# Patient Record
Sex: Male | Born: 1965 | Hispanic: Yes | Marital: Married | Smoking: Current every day smoker
Health system: Southern US, Community
[De-identification: ages and names within clinical notes are randomized; demographics above are authoritative.]

## PROBLEM LIST (undated history)

## (undated) DIAGNOSIS — R7303 Prediabetes: Secondary | ICD-10-CM

## (undated) HISTORY — DX: Prediabetes: R73.03

## (undated) HISTORY — PX: OTHER SURGICAL HISTORY: SHX169

## (undated) HISTORY — PX: HERNIA REPAIR: SHX51

---

## 2008-08-18 HISTORY — PX: FINGER AMPUTATION: SHX636

## 2013-02-24 ENCOUNTER — Emergency Department (HOSPITAL_COMMUNITY)
Admission: EM | Admit: 2013-02-24 | Discharge: 2013-02-24 | Disposition: A | Discharge status: Home / Self Care | Payer: Self-pay | Attending: Emergency Medicine | Admitting: Emergency Medicine

## 2013-02-24 ENCOUNTER — Encounter (HOSPITAL_COMMUNITY): Payer: Self-pay | Admitting: Cardiology

## 2013-02-24 ENCOUNTER — Emergency Department (HOSPITAL_COMMUNITY): Payer: Self-pay

## 2013-02-24 DIAGNOSIS — Y9389 Activity, other specified: Secondary | ICD-10-CM | POA: Insufficient documentation

## 2013-02-24 DIAGNOSIS — S3992XA Unspecified injury of lower back, initial encounter: Secondary | ICD-10-CM

## 2013-02-24 DIAGNOSIS — W010XXA Fall on same level from slipping, tripping and stumbling without subsequent striking against object, initial encounter: Secondary | ICD-10-CM | POA: Insufficient documentation

## 2013-02-24 DIAGNOSIS — F17200 Nicotine dependence, unspecified, uncomplicated: Secondary | ICD-10-CM | POA: Insufficient documentation

## 2013-02-24 DIAGNOSIS — Y92009 Unspecified place in unspecified non-institutional (private) residence as the place of occurrence of the external cause: Secondary | ICD-10-CM | POA: Insufficient documentation

## 2013-02-24 DIAGNOSIS — IMO0002 Reserved for concepts with insufficient information to code with codable children: Secondary | ICD-10-CM | POA: Insufficient documentation

## 2013-02-24 MED ORDER — HYDROCODONE-ACETAMINOPHEN 5-325 MG PO TABS
1.0000 | ORAL_TABLET | Freq: Once | ORAL | Status: AC
  Administered 2013-02-24: 1 via ORAL
  Filled 2013-02-24: qty 1

## 2013-02-24 MED ORDER — HYDROCODONE-ACETAMINOPHEN 5-325 MG PO TABS: 2.0000 | ORAL_TABLET | ORAL | Status: DC | PRN

## 2013-02-24 MED ORDER — METHOCARBAMOL 500 MG PO TABS: 500.0000 mg | ORAL_TABLET | Freq: Two times a day (BID) | ORAL | Status: DC

## 2013-02-24 NOTE — ED Notes (Signed)
Pt reports that he was pulling carpet and tripped on a pipe and fell. Now reports lower back pain. Sensation intact. No obvious injury or bruising.

## 2013-02-24 NOTE — ED Provider Notes (Signed)
History    This chart was scribed for Fayrene Helper, non-physician practitioner working with Juliet Rude. Rubin Payor, MD by Leone Payor, ED Scribe. This patient was seen in room TR10C/TR10C and the patient's care was started at 1631.  CSN: [id number] Arrival date & time 02/24/13  1631  None    Chief Complaint  Patient presents with  . Back Pain    The history is provided by the patient. No language interpreter was used.    HPI Comments: Isaac Austin is a 47 y.o. male who presents to the Emergency Department complaining of constant, unchanged lower back pain that started 2 hours ago. Reports he was pulling carpet when he stepped on a PVC pipe and fell. States he hit his back on a hard floor. Pt was able to ambulate after the fall. He denies LOC. He describes the pain as stabbing. Pain is aggravated by movement, flexion and lateral rotation. Walking is tolerable but pain is worse with sitting position. He has taken 400 mg of motrin with moderate relief. Pt denies any radiation. He denies any numbness.    History reviewed. No pertinent past medical history. History reviewed. No pertinent past surgical history. History reviewed. No pertinent family history. History  Substance Use Topics  . Smoking status: Current Every Day Smoker  . Smokeless tobacco: Not on file  . Alcohol Use: Yes    Review of Systems  Musculoskeletal: Positive for back pain. Negative for gait problem.  Neurological: Negative for syncope, weakness and numbness.    Allergies  Review of patient's allergies indicates no known allergies.  Home Medications  No current outpatient prescriptions on file. BP 126/78  Pulse 89  Temp(Src) 98.5 F (36.9 C) (Oral)  Resp 20  SpO2 97% Physical Exam  Nursing note and vitals reviewed. Constitutional: He is oriented to person, place, and time. He appears well-developed and well-nourished.  Appears to be in no acute distress, sitting comfortably  in bed.   HENT:   Head: Normocephalic and atraumatic.  Eyes: Conjunctivae and EOM are normal. Pupils are equal, round, and reactive to light.  Neck: Normal range of motion. Neck supple.  Cardiovascular: Normal rate, regular rhythm and normal heart sounds.   Pulmonary/Chest: Effort normal and breath sounds normal.  Abdominal: Soft. Bowel sounds are normal.  Musculoskeletal: Normal range of motion.  Has para lumbar spine tender to palpation. Increasing pain with back flexion and lateral rotation. No significant midline tenderness, step offs, or crepitus.   Neurological: He is alert and oriented to person, place, and time.  Normal gait. Patellar and DT reflexes 2+. No foot drop.    Skin: Skin is warm and dry.  Psychiatric: He has a normal mood and affect.    ED Course  Procedures (including critical care time)  DIAGNOSTIC STUDIES: Oxygen Saturation is 97% on RA, adequate by my interpretation.    COORDINATION OF CARE: 5:03 PM Discussed treatment plan with pt at bedside and pt agreed to plan.   6:40 PM Xray neg for acute fx or dislocation.  Reassurance given.  Likely lower back contusion and muscle strain.  Stable for discharge.  Able to ambulate.   Labs Reviewed - No data to display Dg Lumbar Spine Complete  02/24/2013   *RADIOLOGY REPORT*  Clinical Data: Fall.  Low back pain.  LUMBAR SPINE - COMPLETE 4+ VIEW  Comparison: None.  Findings: No evidence of acute fracture, spondylolysis, or spondylolisthesis.  Mild degenerative disc disease is seen at L2-3.  Other intervertebral disc  spaces are maintained.  No evidence of facet arthropathy or other significant bone abnormality.  IMPRESSION:  1.  No acute findings. 2.  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   Original Report Authenticated By: Myles Rosenthal, M.D.   1. Lower back injury, initial encounter     MDM  BP 126/78  Pulse 89  Temp(Src) 98.5 F (36.9 C) (Oral)  Resp 20  SpO2 97%  I have reviewed nursing notes and vital signs. I personally reviewed  the imaging tests through PACS system  I reviewed available ER/hospitalization records thought the EMR   I personally performed the services described in this documentation, which was scribed in my presence. The recorded information has been reviewed and is accurate.    Fayrene Helper, PA-C 02/24/13 [phone]

## 2013-02-26 NOTE — ED Provider Notes (Signed)
Medical screening examination/treatment/procedure(s) were performed by non-physician practitioner and as supervising physician I was immediately available for consultation/collaboration.  Alexx Mcburney R. Ramses Klecka, MD 02/26/13 1358 

## 2016-08-18 DIAGNOSIS — R7303 Prediabetes: Secondary | ICD-10-CM | POA: Insufficient documentation

## 2016-08-18 HISTORY — DX: Prediabetes: R73.03

## 2018-03-30 ENCOUNTER — Encounter: Payer: Self-pay | Admitting: Internal Medicine

## 2018-03-30 ENCOUNTER — Ambulatory Visit: Payer: Self-pay | Admitting: Internal Medicine

## 2018-03-30 VITALS — BP 110/82 | HR 70 | Resp 12 | Ht 65.0 in | Wt 181.0 lb

## 2018-03-30 DIAGNOSIS — Z716 Tobacco abuse counseling: Secondary | ICD-10-CM

## 2018-03-30 DIAGNOSIS — E669 Obesity, unspecified: Secondary | ICD-10-CM

## 2018-03-30 DIAGNOSIS — Z6830 Body mass index (BMI) 30.0-30.9, adult: Secondary | ICD-10-CM

## 2018-03-30 DIAGNOSIS — K439 Ventral hernia without obstruction or gangrene: Secondary | ICD-10-CM | POA: Insufficient documentation

## 2018-03-30 DIAGNOSIS — R7303 Prediabetes: Secondary | ICD-10-CM

## 2018-03-30 DIAGNOSIS — Z720 Tobacco use: Secondary | ICD-10-CM | POA: Insufficient documentation

## 2018-03-30 NOTE — Progress Notes (Signed)
Patient ID: Isaac ScheuermannSalvador Cruz, male   DOB: 05-20-1966, 52 y.o.   MRN: [id number]  LCSW completed new patient screening with patient in order to assess for mental health concerns and/or problems with social determinants of health (food, housing, transportation, interpersonal violence). LCSW administered a PHQ-9 in order to assess the level of current depressive symptoms.  Patient reported that he had no issues with any SDOH. He shared that he feels fatigued every day but felt that the cause was his job. He denied any other depressive symptoms. He shared that he was briefly depressed and suicidal about 5 years ago, as a result of getting part of his finger cut off and being unable to work, but that he has not had any suicidal thoughts since then.
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
## 2018-03-30 NOTE — Patient Instructions (Signed)
Tome un vaso de agua antes de cada comida Tome un minimo de 6 a 8 vasos de agua diarios Coma tres veces al dia Coma una proteina y Neomia Dearuna grasa saludable con comida.  (huevos, pescado, pollo, pavo, y limite carnes rojas Coma 5 porciones diarias de legumbres.  Mezcle los colores Coma 2 porciones diarias de frutas con cascara cuando sea comestible Use platos pequeos Suelte su tenedor o cuchara despues de cada mordida hata que se mastique y se trague Come en la mesa con amigos o familiares por lo menos una vez al dia Apague la televisin y aparatos electrnicos durante la comida  Su objetivo debe ser perder una libra por semana  Estudios recientes indican que las personas quienes consumen todos de sus calorias durante 12 horas se bajan de pesocon Mas eficiencia.  Por ejemplo, si Usted come su primera comida a las 7:00 a.m., su comida final del dia se debe completar antes de las 7:00 p.m.  Tobacco Cessation:   1800QUITNOW or [phone], the former for support and possibly free nicotine patches/gum and support; the latter for [facility] Smoking cessation class. Get rid of all smoking supplies:  Cigarettes, lighters, ashtrays--no stashes just in case at home if you are serious.  For Nicotine gum:  When you feel need for smoking:  Place nicotine gum in mouth and chew until juicy, then stick between gum and cheek.  You will absorb the nicotine from your mouth.   Do not aggressively chew gum. Spit gum out in garbage once you feel you are done with it.

## 2018-04-01 ENCOUNTER — Other Ambulatory Visit: Payer: Self-pay

## 2018-04-01 DIAGNOSIS — Z79899 Other long term (current) drug therapy: Secondary | ICD-10-CM

## 2018-04-01 DIAGNOSIS — Z13220 Encounter for screening for lipoid disorders: Secondary | ICD-10-CM

## 2018-04-01 DIAGNOSIS — R7303 Prediabetes: Secondary | ICD-10-CM

## 2018-04-02 LAB — COMPREHENSIVE METABOLIC PANEL
A/G RATIO: 1.3 (ref 1.2–2.2)
ALT: 30 IU/L (ref 0–44)
AST: 22 IU/L (ref 0–40)
Albumin: 4.3 g/dL (ref 3.5–5.5)
Alkaline Phosphatase: 62 IU/L (ref 39–117)
BILIRUBIN TOTAL: 0.5 mg/dL (ref 0.0–1.2)
BUN/Creatinine Ratio: 17 (ref 9–20)
BUN: 14 mg/dL (ref 6–24)
CHLORIDE: 104 mmol/L (ref 96–106)
CO2: 24 mmol/L (ref 20–29)
Calcium: 9.1 mg/dL (ref 8.7–10.2)
Creatinine, Ser: 0.83 mg/dL (ref 0.76–1.27)
GFR calc Af Amer: 118 mL/min/{1.73_m2} (ref >59)
GFR calc non Af Amer: 102 mL/min/{1.73_m2} (ref >59)
Globulin, Total: 3.3 g/dL (ref 1.5–4.5)
Glucose: 108 mg/dL — ABNORMAL HIGH (ref 65–99)
POTASSIUM: 4.4 mmol/L (ref 3.5–5.2)
Sodium: 140 mmol/L (ref 134–144)
Total Protein: 7.6 g/dL (ref 6.0–8.5)

## 2018-04-02 LAB — CBC WITH DIFFERENTIAL/PLATELET
BASOS: 0 %
Basophils Absolute: 0 10*3/uL (ref 0.0–0.2)
EOS (ABSOLUTE): 0 10*3/uL (ref 0.0–0.4)
Eos: 1 %
Hematocrit: 45.5 % (ref 37.5–51.0)
Hemoglobin: 15.1 g/dL (ref 13.0–17.7)
IMMATURE GRANULOCYTES: 0 %
Immature Grans (Abs): 0 10*3/uL (ref 0.0–0.1)
Lymphocytes Absolute: 1.9 10*3/uL (ref 0.7–3.1)
Lymphs: 38 %
MCH: 29.3 pg (ref 26.6–33.0)
MCHC: 33.2 g/dL (ref 31.5–35.7)
MCV: 88 fL (ref 79–97)
MONOS ABS: 0.4 10*3/uL (ref 0.1–0.9)
Monocytes: 9 %
NEUTROS PCT: 52 %
Neutrophils Absolute: 2.6 10*3/uL (ref 1.4–7.0)
Platelets: 190 10*3/uL (ref 150–450)
RBC: 5.15 x10E6/uL (ref 4.14–5.80)
RDW: 14.6 % (ref 12.3–15.4)
WBC: 4.9 10*3/uL (ref 3.4–10.8)

## 2018-04-02 LAB — LIPID PANEL W/O CHOL/HDL RATIO
Cholesterol, Total: 202 mg/dL — ABNORMAL HIGH (ref 100–199)
HDL: 52 mg/dL (ref >39)
LDL CALC: 132 mg/dL — AB (ref 0–99)
Triglycerides: 91 mg/dL (ref 0–149)
VLDL CHOLESTEROL CAL: 18 mg/dL (ref 5–40)

## 2018-04-02 LAB — HGB A1C W/O EAG: HEMOGLOBIN A1C: 6.1 % — AB (ref 4.8–5.6)

## 2018-04-22 ENCOUNTER — Encounter (HOSPITAL_COMMUNITY): Payer: Self-pay | Admitting: Cardiology

## 2018-11-19 ENCOUNTER — Ambulatory Visit: Payer: Self-pay | Admitting: General Surgery

## 2018-12-30 NOTE — Pre-Procedure Instructions (Signed)
Isaac Austin  12/30/2018      [facility] - [place], [place] - [address] [place] [address] Phone: [phone] Fax: [phone]    Your procedure is scheduled on Fri., Jan 07, 2019 from 9:30AM-11:30AM  Report to [facility] Entr[facility] "A" at 7:30AM  Call this number if you have problems the morning of surgery:  [phone]   Remember:  Do not eat after midnight on May 21st  You may drink clear liquids until 3 hours (6:30AM) prior to surgery time .  Clear liquids allowed are: Water, Juice (non-citric and without pulp), Carbonated beverages, Clear Tea, Black Coffee only, Plain Jell-O only, Gatorade and Plain Popsicles only    Take these medicines the morning of surgery with A SIP OF WATER: If needed: Cetirizine (ZYRTEC)   As of today, stop taking all Other Aspirin Products, Vitamins, Fish oils, and Herbal medications. Also stop all NSAIDS i.e. Advil, Ibuprofen, Motrin, Aleve, Anaprox, Naproxen, BC, Goody Powders, and all Supplements.    Do not wear jewelry.  Do not wear lotions, powders, colognes, or deodorant.  Do not shave 48 hours prior to surgery.  Men may shave face.  Do not bring valuables to the hospital.  [facility] is not responsible for any belongings or valuables.  Contacts, dentures or bridgework may not be worn into surgery.   For patients admitted to the hospital, discharge time will be determined by your treatment team.  Patients discharged the day of surgery will not be allowed to drive home.   Special instructions:   [facility]- Preparing For Surgery  Before surgery, you can play an important role. Because skin is not sterile, your skin needs to be as free of germs as possible. You can reduce the number of germs on your skin by washing with CHG (chlorahexidine gluconate) Soap before surgery.  CHG is an antiseptic cleaner which kills germs and bonds with the skin to continue killing germs even  after washing.    Oral Hygiene is also important to reduce your risk of infection.  Remember - BRUSH YOUR TEETH THE MORNING OF SURGERY WITH YOUR REGULAR TOOTHPASTE  Please do not use if you have an allergy to CHG or antibacterial soaps. If your skin becomes reddened/irritated stop using the CHG.  Do not shave (including legs and underarms) for at least 48 hours prior to first CHG shower. It is OK to shave your face.  Please follow these instructions carefully.   1. Shower the NIGHT BEFORE SURGERY and the MORNING OF SURGERY with CHG.   2. If you chose to wash your hair, wash your hair first as usual with your normal shampoo.  3. After you shampoo, rinse your hair and body thoroughly to remove the shampoo.  4. Use CHG as you would any other liquid soap. You can apply CHG directly to the skin and wash gently with a scrungie or a clean washcloth.   5. Apply the CHG Soap to your body ONLY FROM THE NECK DOWN.  Do not use on open wounds or open sores. Avoid contact with your eyes, ears, mouth and genitals (private parts). Wash Face and genitals (private parts)  with your normal soap.  6. Wash thoroughly, paying special attention to the area where your surgery will be performed.  7. Thoroughly rinse your body with warm water from the neck down.  8. DO NOT shower/wash with your normal soap after using and rinsing off the  CHG Soap.  9. Pat yourself dry with a CLEAN TOWEL.  10. Wear CLEAN PAJAMAS to bed the night before surgery, wear comfortable clothes the morning of surgery  11. Place CLEAN SHEETS on your bed the night of your first shower and DO NOT SLEEP WITH PETS.  Day of Surgery:  Do not apply any deodorants/lotions.  Please wear clean clothes to the hospital/surgery center.   Remember to brush your teeth WITH YOUR REGULAR TOOTHPASTE.  Please read over the following fact sheets that you were given. Pain Booklet, Coughing and Deep Breathing and Surgical Site Infection  Prevention

## 2018-12-31 ENCOUNTER — Encounter (HOSPITAL_COMMUNITY)
Admission: RE | Admit: 2018-12-31 | Discharge: 2018-12-31 | Disposition: A | Discharge status: Home / Self Care | Payer: Self-pay | Source: Ambulatory Visit | Attending: General Surgery | Admitting: General Surgery

## 2018-12-31 ENCOUNTER — Encounter (HOSPITAL_COMMUNITY): Payer: Self-pay

## 2018-12-31 ENCOUNTER — Other Ambulatory Visit: Payer: Self-pay

## 2018-12-31 DIAGNOSIS — Z01812 Encounter for preprocedural laboratory examination: Secondary | ICD-10-CM | POA: Insufficient documentation

## 2018-12-31 LAB — BASIC METABOLIC PANEL
Anion gap: 9 (ref 5–15)
BUN: 12 mg/dL (ref 6–20)
CO2: 24 mmol/L (ref 22–32)
Calcium: 9.2 mg/dL (ref 8.9–10.3)
Chloride: 104 mmol/L (ref 98–111)
Creatinine, Ser: 0.95 mg/dL (ref 0.61–1.24)
GFR calc Af Amer: >60 mL/min (ref >60)
GFR calc non Af Amer: >60 mL/min (ref >60)
Glucose, Bld: 129 mg/dL — ABNORMAL HIGH (ref 70–99)
Potassium: 4 mmol/L (ref 3.5–5.1)
Sodium: 137 mmol/L (ref 135–145)

## 2018-12-31 LAB — CBC
HCT: 45.3 % (ref 39.0–52.0)
Hemoglobin: 15.2 g/dL (ref 13.0–17.0)
MCH: 28.8 pg (ref 26.0–34.0)
MCHC: 33.6 g/dL (ref 30.0–36.0)
MCV: 86 fL (ref 80.0–100.0)
Platelets: 185 10*3/uL (ref 150–400)
RBC: 5.27 MIL/uL (ref 4.22–5.81)
RDW: 13.3 % (ref 11.5–15.5)
WBC: 5.8 10*3/uL (ref 4.0–10.5)
nRBC: 0 % (ref 0.0–0.2)

## 2018-12-31 LAB — GLUCOSE, CAPILLARY: Glucose-Capillary: 111 mg/dL — ABNORMAL HIGH (ref 70–99)

## 2018-12-31 NOTE — Progress Notes (Signed)
PCP -Dr. Julieanne Manson  Cardiologist - denies  Chest x-ray - N/A EKG - N/A Stress Test -denies  ECHO - denies Cardiac Cath - denies  Sleep Study - denies CPAP - denies  Blood Thinner Instructions:N/A Aspirin Instructions:N/A  Anesthesia review: No  This RN made an appointment for patient's CoVID-19 testing for Tuesday, May 19th at 1100 at the drive thru testing site. Interpreter was here to help verbalize instructions. Pt also educated on quarantine after testing which includes not going to work. Pt verbally agrees.   Patient denies shortness of breath, fever, cough and chest pain at PAT appointment   Patient verbalized understanding of instructions that were given to them at the PAT appointment. Patient was also instructed that they will need to review over the PAT instructions again at home before surgery.    Coronavirus Screening  Have you experienced the following symptoms:  Cough yes/no: No Fever (>100.52F)  yes/no: No Runny nose yes/no: No Sore throat yes/no: No Difficulty breathing/shortness of breath  yes/no: No  Have you or a family member traveled in the last 14 days and where? yes/no: No   If the patient indicates "YES" to the above questions, their PAT will be rescheduled to limit the exposure to others and, the surgeon will be notified. THE PATIENT WILL NEED TO BE ASYMPTOMATIC FOR 14 DAYS.   If the patient is not experiencing any of these symptoms, the PAT nurse will instruct them to NOT bring anyone with them to their appointment since they may have these symptoms or traveled as well.   Please remind your patients and families that hospital visitation restrictions are in effect and the importance of the restrictions.

## 2019-01-04 ENCOUNTER — Other Ambulatory Visit (HOSPITAL_COMMUNITY)
Admission: RE | Admit: 2019-01-04 | Discharge: 2019-01-04 | Disposition: A | Discharge status: Home / Self Care | Payer: HRSA Program | Source: Ambulatory Visit | Attending: General Surgery | Admitting: General Surgery

## 2019-01-04 DIAGNOSIS — Z1159 Encounter for screening for other viral diseases: Secondary | ICD-10-CM | POA: Diagnosis present

## 2019-01-05 LAB — NOVEL CORONAVIRUS, NAA (HOSP ORDER, SEND-OUT TO REF LAB; TAT 18-24 HRS): SARS-CoV-2, NAA: NOT DETECTED

## 2019-01-07 ENCOUNTER — Ambulatory Visit (HOSPITAL_COMMUNITY)
Admission: RE | Admit: 2019-01-07 | Discharge: 2019-01-07 | Disposition: A | Discharge status: Home / Self Care | Payer: Self-pay | Attending: General Surgery | Admitting: General Surgery

## 2019-01-07 ENCOUNTER — Encounter (HOSPITAL_COMMUNITY)
Admission: RE | Disposition: A | Discharge status: Home / Self Care | Payer: Self-pay | Source: Home / Self Care | Attending: General Surgery

## 2019-01-07 ENCOUNTER — Ambulatory Visit (HOSPITAL_COMMUNITY): Payer: Self-pay | Admitting: Vascular Surgery

## 2019-01-07 ENCOUNTER — Other Ambulatory Visit: Payer: Self-pay

## 2019-01-07 ENCOUNTER — Ambulatory Visit (HOSPITAL_COMMUNITY): Payer: Self-pay | Admitting: Certified Registered Nurse Anesthetist

## 2019-01-07 ENCOUNTER — Encounter (HOSPITAL_COMMUNITY): Payer: Self-pay

## 2019-01-07 DIAGNOSIS — R7303 Prediabetes: Secondary | ICD-10-CM | POA: Insufficient documentation

## 2019-01-07 DIAGNOSIS — K439 Ventral hernia without obstruction or gangrene: Secondary | ICD-10-CM | POA: Insufficient documentation

## 2019-01-07 DIAGNOSIS — F17200 Nicotine dependence, unspecified, uncomplicated: Secondary | ICD-10-CM | POA: Insufficient documentation

## 2019-01-07 HISTORY — PX: VENTRAL HERNIA REPAIR: SHX424

## 2019-01-07 LAB — GLUCOSE, CAPILLARY: Glucose-Capillary: 104 mg/dL — ABNORMAL HIGH (ref 70–99)

## 2019-01-07 SURGERY — REPAIR, HERNIA, VENTRAL, LAPAROSCOPIC
Anesthesia: General | Site: Abdomen

## 2019-01-07 MED ORDER — HYDROCODONE-ACETAMINOPHEN 5-325 MG PO TABS
ORAL_TABLET | ORAL | Status: AC
  Filled 2019-01-07: qty 2

## 2019-01-07 MED ORDER — FENTANYL CITRATE (PF) 250 MCG/5ML IJ SOLN
INTRAMUSCULAR | Status: AC
  Filled 2019-01-07: qty 5

## 2019-01-07 MED ORDER — ACETAMINOPHEN 500 MG PO TABS
ORAL_TABLET | ORAL | Status: AC
  Administered 2019-01-07: 08:00:00 1000 mg via ORAL
  Filled 2019-01-07: qty 2

## 2019-01-07 MED ORDER — CEFAZOLIN SODIUM-DEXTROSE 2-4 GM/100ML-% IV SOLN
2.0000 g | INTRAVENOUS | Status: AC
  Administered 2019-01-07: 2 g via INTRAVENOUS

## 2019-01-07 MED ORDER — HYDROMORPHONE HCL 1 MG/ML IJ SOLN
INTRAMUSCULAR | Status: AC
  Filled 2019-01-07: qty 1

## 2019-01-07 MED ORDER — HYDROCODONE-ACETAMINOPHEN 5-325 MG PO TABS: 1.0000 | ORAL_TABLET | Freq: Four times a day (QID) | ORAL | 0 refills | Status: AC | PRN

## 2019-01-07 MED ORDER — DEXAMETHASONE SODIUM PHOSPHATE 10 MG/ML IJ SOLN
INTRAMUSCULAR | Status: DC | PRN
  Administered 2019-01-07: 5 mg via INTRAVENOUS

## 2019-01-07 MED ORDER — CHLORHEXIDINE GLUCONATE CLOTH 2 % EX PADS: 6.0000 | MEDICATED_PAD | Freq: Once | CUTANEOUS | Status: DC

## 2019-01-07 MED ORDER — BUPIVACAINE-EPINEPHRINE 0.25% -1:200000 IJ SOLN
INTRAMUSCULAR | Status: DC | PRN
  Administered 2019-01-07: 24 mL

## 2019-01-07 MED ORDER — BUPIVACAINE-EPINEPHRINE (PF) 0.25% -1:200000 IJ SOLN
INTRAMUSCULAR | Status: AC
  Filled 2019-01-07: qty 30

## 2019-01-07 MED ORDER — ROCURONIUM BROMIDE 50 MG/5ML IV SOSY
PREFILLED_SYRINGE | INTRAVENOUS | Status: DC | PRN
  Administered 2019-01-07: 10 mg via INTRAVENOUS
  Administered 2019-01-07: 50 mg via INTRAVENOUS

## 2019-01-07 MED ORDER — FENTANYL CITRATE (PF) 250 MCG/5ML IJ SOLN
INTRAMUSCULAR | Status: DC | PRN
  Administered 2019-01-07: 100 ug via INTRAVENOUS
  Administered 2019-01-07: 25 ug via INTRAVENOUS
  Administered 2019-01-07: 50 ug via INTRAVENOUS

## 2019-01-07 MED ORDER — CELECOXIB 200 MG PO CAPS
ORAL_CAPSULE | ORAL | Status: AC
  Administered 2019-01-07: 08:00:00 200 mg via ORAL
  Filled 2019-01-07: qty 1

## 2019-01-07 MED ORDER — PHENYLEPHRINE 40 MCG/ML (10ML) SYRINGE FOR IV PUSH (FOR BLOOD PRESSURE SUPPORT)
PREFILLED_SYRINGE | INTRAVENOUS | Status: AC
  Filled 2019-01-07: qty 10

## 2019-01-07 MED ORDER — CELECOXIB 200 MG PO CAPS
200.0000 mg | ORAL_CAPSULE | ORAL | Status: AC
  Administered 2019-01-07: 08:00:00 200 mg via ORAL

## 2019-01-07 MED ORDER — SUCCINYLCHOLINE CHLORIDE 200 MG/10ML IV SOSY
PREFILLED_SYRINGE | INTRAVENOUS | Status: DC | PRN
  Administered 2019-01-07: 100 mg via INTRAVENOUS

## 2019-01-07 MED ORDER — LIDOCAINE 2% (20 MG/ML) 5 ML SYRINGE
INTRAMUSCULAR | Status: DC | PRN
  Administered 2019-01-07: 60 mg via INTRAVENOUS

## 2019-01-07 MED ORDER — 0.9 % SODIUM CHLORIDE (POUR BTL) OPTIME
TOPICAL | Status: DC | PRN
  Administered 2019-01-07: 1000 mL

## 2019-01-07 MED ORDER — MEPERIDINE HCL 25 MG/ML IJ SOLN: 6.2500 mg | INTRAMUSCULAR | Status: DC | PRN

## 2019-01-07 MED ORDER — MIDAZOLAM HCL 2 MG/2ML IJ SOLN
INTRAMUSCULAR | Status: AC
  Filled 2019-01-07: qty 2

## 2019-01-07 MED ORDER — SUGAMMADEX SODIUM 200 MG/2ML IV SOLN
INTRAVENOUS | Status: DC | PRN
  Administered 2019-01-07: 200 mg via INTRAVENOUS

## 2019-01-07 MED ORDER — CEFAZOLIN SODIUM-DEXTROSE 2-4 GM/100ML-% IV SOLN
INTRAVENOUS | Status: AC
  Filled 2019-01-07: qty 100

## 2019-01-07 MED ORDER — HYDROMORPHONE HCL 1 MG/ML IJ SOLN
0.2500 mg | INTRAMUSCULAR | Status: DC | PRN
  Administered 2019-01-07 (×4): 0.5 mg via INTRAVENOUS

## 2019-01-07 MED ORDER — GABAPENTIN 300 MG PO CAPS
ORAL_CAPSULE | ORAL | Status: AC
  Administered 2019-01-07: 08:00:00 300 mg via ORAL
  Filled 2019-01-07: qty 1

## 2019-01-07 MED ORDER — ONDANSETRON HCL 4 MG/2ML IJ SOLN
INTRAMUSCULAR | Status: DC | PRN
  Administered 2019-01-07: 4 mg via INTRAVENOUS

## 2019-01-07 MED ORDER — PROPOFOL 10 MG/ML IV BOLUS
INTRAVENOUS | Status: DC | PRN
  Administered 2019-01-07: 200 mg via INTRAVENOUS

## 2019-01-07 MED ORDER — MIDAZOLAM HCL 2 MG/2ML IJ SOLN
INTRAMUSCULAR | Status: DC | PRN
  Administered 2019-01-07: 2 mg via INTRAVENOUS

## 2019-01-07 MED ORDER — LACTATED RINGERS IV SOLN
INTRAVENOUS | Status: DC
  Administered 2019-01-07 (×2): via INTRAVENOUS

## 2019-01-07 MED ORDER — MIDAZOLAM HCL 2 MG/2ML IJ SOLN: 0.5000 mg | Freq: Once | INTRAMUSCULAR | Status: DC | PRN

## 2019-01-07 MED ORDER — GABAPENTIN 300 MG PO CAPS
300.0000 mg | ORAL_CAPSULE | ORAL | Status: AC
  Administered 2019-01-07: 08:00:00 300 mg via ORAL

## 2019-01-07 MED ORDER — PROMETHAZINE HCL 25 MG/ML IJ SOLN: 6.2500 mg | INTRAMUSCULAR | Status: DC | PRN

## 2019-01-07 MED ORDER — PROPOFOL 10 MG/ML IV BOLUS
INTRAVENOUS | Status: AC
  Filled 2019-01-07: qty 20

## 2019-01-07 MED ORDER — FENTANYL CITRATE (PF) 100 MCG/2ML IJ SOLN
INTRAMUSCULAR | Status: AC
  Filled 2019-01-07: qty 2

## 2019-01-07 MED ORDER — ACETAMINOPHEN 500 MG PO TABS
1000.0000 mg | ORAL_TABLET | ORAL | Status: AC
  Administered 2019-01-07: 08:00:00 1000 mg via ORAL

## 2019-01-07 MED ORDER — HYDROCODONE-ACETAMINOPHEN 5-325 MG PO TABS
1.0000 | ORAL_TABLET | Freq: Four times a day (QID) | ORAL | Status: DC | PRN
  Administered 2019-01-07: 12:00:00 2 via ORAL

## 2019-01-07 SURGICAL SUPPLY — 43 items
BINDER ABDOMINAL 12 ML 46-62 (SOFTGOODS) IMPLANT
BNDG GAUZE ELAST 4 BULKY (GAUZE/BANDAGES/DRESSINGS) IMPLANT
CANISTER SUCT 3000ML PPV (MISCELLANEOUS) IMPLANT
CHLORAPREP W/TINT 26ML (MISCELLANEOUS) ×3 IMPLANT
COVER SURGICAL LIGHT HANDLE (MISCELLANEOUS) ×3 IMPLANT
COVER WAND RF STERILE (DRAPES) ×3 IMPLANT
DERMABOND ADVANCED (GAUZE/BANDAGES/DRESSINGS) ×2
DERMABOND ADVANCED .7 DNX12 (GAUZE/BANDAGES/DRESSINGS) ×1 IMPLANT
DEVICE SECURE STRAP 25 ABSORB (INSTRUMENTS) ×6 IMPLANT
DEVICE TROCAR PUNCTURE CLOSURE (ENDOMECHANICALS) ×3 IMPLANT
DRAPE INCISE IOBAN 66X45 STRL (DRAPES) ×3 IMPLANT
DRAPE LAPAROSCOPIC ABDOMINAL (DRAPES) ×3 IMPLANT
ELECT CAUTERY BLADE 6.4 (BLADE) ×3 IMPLANT
ELECT REM PT RETURN 9FT ADLT (ELECTROSURGICAL) ×3
ELECTRODE REM PT RTRN 9FT ADLT (ELECTROSURGICAL) ×1 IMPLANT
GLOVE BIO SURGEON STRL SZ7.5 (GLOVE) ×3 IMPLANT
GOWN STRL REUS W/ TWL LRG LVL3 (GOWN DISPOSABLE) ×3 IMPLANT
GOWN STRL REUS W/TWL LRG LVL3 (GOWN DISPOSABLE) ×6
KIT BASIN OR (CUSTOM PROCEDURE TRAY) ×3 IMPLANT
KIT TURNOVER KIT B (KITS) ×3 IMPLANT
MARKER SKIN DUAL TIP RULER LAB (MISCELLANEOUS) ×3 IMPLANT
MESH VENTRALIGHT ST 4X6IN (Mesh General) ×3 IMPLANT
NEEDLE SPNL 22GX3.5 QUINCKE BK (NEEDLE) ×3 IMPLANT
NS IRRIG 1000ML POUR BTL (IV SOLUTION) ×3 IMPLANT
PAD ARMBOARD 7.5X6 YLW CONV (MISCELLANEOUS) ×6 IMPLANT
PENCIL BUTTON HOLSTER BLD 10FT (ELECTRODE) ×3 IMPLANT
SCISSORS LAP 5X35 DISP (ENDOMECHANICALS) IMPLANT
SET IRRIG TUBING LAPAROSCOPIC (IRRIGATION / IRRIGATOR) IMPLANT
SET TUBE SMOKE EVAC HIGH FLOW (TUBING) ×3 IMPLANT
SHEARS HARMONIC ACE PLUS 36CM (ENDOMECHANICALS) IMPLANT
SLEEVE ENDOPATH XCEL 5M (ENDOMECHANICALS) ×3 IMPLANT
SUT MNCRL AB 4-0 PS2 18 (SUTURE) ×3 IMPLANT
SUT NOVA NAB DX-16 0-1 5-0 T12 (SUTURE) ×3 IMPLANT
SUT VIC AB 3-0 SH 27 (SUTURE) ×2
SUT VIC AB 3-0 SH 27XBRD (SUTURE) ×1 IMPLANT
TOWEL OR 17X24 6PK STRL BLUE (TOWEL DISPOSABLE) ×3 IMPLANT
TOWEL OR 17X26 10 PK STRL BLUE (TOWEL DISPOSABLE) ×3 IMPLANT
TRAY FOLEY CATH SILVER 16FR (SET/KITS/TRAYS/PACK) ×3 IMPLANT
TRAY LAPAROSCOPIC MC (CUSTOM PROCEDURE TRAY) ×3 IMPLANT
TROCAR XCEL BLUNT TIP 100MML (ENDOMECHANICALS) IMPLANT
TROCAR XCEL NON-BLD 11X100MML (ENDOMECHANICALS) IMPLANT
TROCAR XCEL NON-BLD 5MMX100MML (ENDOMECHANICALS) ×6 IMPLANT
WATER STERILE IRR 1000ML POUR (IV SOLUTION) ×3 IMPLANT

## 2019-01-07 NOTE — Op Note (Signed)
01/07/2019  11:22 AM  PATIENT:  Isaac Austin  53 y.o. male  PRE-OPERATIVE DIAGNOSIS:  VENTRAL HERNIA  POST-OPERATIVE DIAGNOSIS:  VENTRAL HERNIA  PROCEDURE:  Procedure(s): LAPAROSCOPIC ASSISTED VENTRAL HERNIA REPAIR WITH MESH (N/A)  SURGEON:  Surgeon(s) and Role:    * Griselda Miner, MD - Primary    * Claud Kelp, MD - Assisting  PHYSICIAN ASSISTANT:   ASSISTANTS: Dr. Derrell Lolling   ANESTHESIA:   local and general  EBL:  minimal   BLOOD ADMINISTERED:none  DRAINS: none   LOCAL MEDICATIONS USED:  MARCAINE     SPECIMEN:  No Specimen  DISPOSITION OF SPECIMEN:  N/A  COUNTS:  YES  TOURNIQUET:  * No tourniquets in log *  DICTATION: .Dragon Dictation   After informed consent was obtained the patient was brought to the operating room and placed in the supine position on the operating table.  After adequate induction of general anesthesia the patient's abdomen was prepped with ChloraPrep, allowed to dry, and draped in usual sterile manner including the use of an Ioban drape.  An appropriate timeout was performed.  A site was chosen in the right upper quadrant for access in the abdominal cavity.  This area was infiltrated with quarter percent Marcaine.  A small stab incision was made with a 15 blade knife.  A 5 mm Optiview port and camera were used to bluntly dissected the layers of the abdominal wall under direct vision until access was gained to the abdominal cavity.  The abdomen was then insufflated with carbon dioxide without difficulty.  Another 5 mm port was placed in the right lower quadrant under direct vision.  Third 5 mm port was placed on the left midabdomen also under direct vision.  The abdomen was inspected and there were some omental adhesion to the anterior abdominal wall just above the umbilicus.  There was no obvious hernia at the umbilicus.  A harmonic scalpel was used to take down the omental adhesions to the abdominal wall.  I was able to identify a small 2 cm  fascial defect at this site.  I then chose a 10 x 15 cm piece of ventral light mesh.  I placed four #1 Novafil stitches at equidistant points around the edge of the mesh.  The mesh was oriented with the coated side down towards the bowel.  Next a small incision was made overlying the fascial defect with 15 blade knife.  The incision was carried through the skin and subcutaneous tissue sharply with electrocautery until the hernia defect was opened.  The mesh was placed into the abdominal cavity through this opening in the appropriate orientation.  The fascial defect was then closed with a figure-of-eight #1 Novafil stitch.  The abdomen was then insufflated again.  4 small stab incisions were made at points corresponding to the placement of the anchor stitches.  A suture passer was then used to pass through the abdominal wall and bring each tag of Novafil through the abdominal wall at the appropriate position.  Once this was accomplished all 4 Novafil stitches were pulled up tight against the abdominal wall and the mesh was seen to nicely approximate the abdominal wall without any gaps or redundancy.  Each of the stitches was cinched down and tied.  Next a secure strap tacker was used to place tacks between the stitches so that there were no gaps and the mesh was in good apposition to the abdominal wall.  Once this was accomplished the mesh appeared to be in  good position and the hernia seem well repaired and covered.  The area was examined and found to be hemostatic.  The rest of the abdominal cavity was inspected and no other abnormalities were noted.  The gas was then allowed to escape and the ports were removed.  The incisions were then closed with interrupted 4-0 Monocryl subcuticular stitches.  Dermabond dressings were applied.  The patient tolerated the procedure well.  At the end of the case all needle sponge and instrument counts were correct.  The patient was then awakened and taken to recovery in stable  condition.  PLAN OF CARE: Discharge to home after PACU  PATIENT DISPOSITION:  PACU - hemodynamically stable.   Delay start of Pharmacological VTE agent (>24hrs) due to surgical blood loss or risk of bleeding: not applicable

## 2019-01-07 NOTE — Interval H&P Note (Signed)
History and Physical Interval Note:  01/07/2019 9:52 AM  Isaac Austin  has presented today for surgery, with the diagnosis of VENTRAL HERNIA.  The various methods of treatment have been discussed with the patient and family. After consideration of risks, benefits and other options for treatment, the patient has consented to  Procedure(s): LAPAROSCOPIC ASSISTED VENTRAL HERNIA REPAIR WITH MESH (N/A) as a surgical intervention.  The patient's history has been reviewed, patient examined, no change in status, stable for surgery.  I have reviewed the patient's chart and labs.  Questions were answered to the patient's satisfaction.     Chevis Pretty III

## 2019-01-07 NOTE — Anesthesia Postprocedure Evaluation (Signed)
Anesthesia Post Note  Patient: Isaac Austin  Procedure(s) Performed: LAPAROSCOPIC ASSISTED VENTRAL HERNIA REPAIR WITH MESH (N/A Abdomen)     Patient location during evaluation: PACU Anesthesia Type: General Level of consciousness: awake and alert Pain management: pain level controlled Vital Signs Assessment: post-procedure vital signs reviewed and stable Respiratory status: spontaneous breathing, nonlabored ventilation and respiratory function stable Cardiovascular status: blood pressure returned to baseline and stable Postop Assessment: no apparent nausea or vomiting Anesthetic complications: no    Last Vitals:  Vitals:   01/07/19 1215 01/07/19 1219  BP:  135/85  Pulse: 84 92  Resp: 17 18  Temp:    SpO2: 96% 93%                  Beryle Lathe

## 2019-01-07 NOTE — Anesthesia Preprocedure Evaluation (Addendum)
Anesthesia Evaluation  Patient identified by MRN, date of birth, ID band Patient awake    Reviewed: Allergy & Precautions, NPO status , Patient's Chart, lab work & pertinent test results  History of Anesthesia Complications Negative for: history of anesthetic complications  Airway Mallampati: II  TM Distance: >3 FB Neck ROM: Full    Dental  (+) Dental Advisory Given, Partial Upper   Pulmonary Current Smoker,  SARS coronavirus NEG   breath sounds clear to auscultation       Cardiovascular negative cardio ROS   Rhythm:Regular Rate:Normal     Neuro/Psych negative neurological ROS  negative psych ROS   GI/Hepatic negative GI ROS, Neg liver ROS,   Endo/Other   Pre-DM   Renal/GU negative Renal ROS     Musculoskeletal negative musculoskeletal ROS (+)   Abdominal   Peds  Hematology negative hematology ROS (+)   Anesthesia Other Findings   Reproductive/Obstetrics                            Anesthesia Physical Anesthesia Plan  ASA: II  Anesthesia Plan: General   Post-op Pain Management:    Induction: Intravenous  PONV Risk Score and Plan: 3 and Treatment may vary due to age or medical condition, Ondansetron, Midazolam and Dexamethasone  Airway Management Planned: Oral ETT  Additional Equipment: None  Intra-op Plan:   Post-operative Plan: Extubation in OR  Informed Consent: I have reviewed the patients History and Physical, chart, labs and discussed the procedure including the risks, benefits and alternatives for the proposed anesthesia with the patient or authorized representative who has indicated his/her understanding and acceptance.     Dental advisory given  Plan Discussed with: CRNA and Anesthesiologist  Anesthesia Plan Comments:         Anesthesia Quick Evaluation

## 2019-01-07 NOTE — Anesthesia Procedure Notes (Signed)
Procedure Name: Intubation Date/Time: 01/07/2019 9:59 AM Performed by: Modena Morrow, CRNA Pre-anesthesia Checklist: Patient identified, Emergency Drugs available, Suction available, Patient being monitored and Timeout performed Patient Re-evaluated:Patient Re-evaluated prior to induction Oxygen Delivery Method: Circle system utilized Preoxygenation: Pre-oxygenation with 100% oxygen Induction Type: IV induction, Rapid sequence and Cricoid Pressure applied Laryngoscope Size: Miller and 2 Grade View: Grade I Tube type: Oral Number of attempts: 1 Airway Equipment and Method: Stylet and Oral airway Placement Confirmation: ETT inserted through vocal cords under direct vision,  positive ETCO2 and breath sounds checked- equal and bilateral Secured at: 23 cm Tube secured with: Tape Dental Injury: Teeth and Oropharynx as per pre-operative assessment
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
## 2019-01-07 NOTE — Transfer of Care (Signed)
Immediate Anesthesia Transfer of Care Note  Patient: Isaac Austin  Procedure(s) Performed: LAPAROSCOPIC ASSISTED VENTRAL HERNIA REPAIR WITH MESH (N/A Abdomen)  Patient Location: PACU  Anesthesia Type:General  Level of Consciousness: patient cooperative and responds to stimulation  Airway & Oxygen Therapy: Patient Spontanous Breathing  Post-op Assessment: Report given to RN, Post -op Vital signs reviewed and stable and Patient moving all extremities X 4  Post vital signs: Reviewed and stable  Last Vitals:  Vitals Value Taken Time  BP 137/91 01/07/2019 11:34 AM  Temp 36.7 C 01/07/2019 11:33 AM  Pulse 89 01/07/2019 11:35 AM  Resp 20 01/07/2019 11:35 AM  SpO2 96 % 01/07/2019 11:35 AM  Vitals shown include unvalidated device data.  Last Pain:  Vitals:   01/07/19 0817  TempSrc:   PainSc: 0-No pain      Patients Stated Pain Goal: 2 (01/07/19 0817)  Complications: No apparent anesthesia complications

## 2019-01-09 ENCOUNTER — Emergency Department (HOSPITAL_COMMUNITY): Payer: Self-pay

## 2019-01-09 ENCOUNTER — Encounter (HOSPITAL_COMMUNITY): Payer: Self-pay | Admitting: Emergency Medicine

## 2019-01-09 ENCOUNTER — Emergency Department (HOSPITAL_COMMUNITY)
Admission: EM | Admit: 2019-01-09 | Discharge: 2019-01-09 | Disposition: A | Discharge status: Home / Self Care | Payer: Self-pay | Attending: Emergency Medicine | Admitting: Emergency Medicine

## 2019-01-09 ENCOUNTER — Other Ambulatory Visit: Payer: Self-pay

## 2019-01-09 DIAGNOSIS — F17210 Nicotine dependence, cigarettes, uncomplicated: Secondary | ICD-10-CM | POA: Insufficient documentation

## 2019-01-09 DIAGNOSIS — R55 Syncope and collapse: Secondary | ICD-10-CM | POA: Insufficient documentation

## 2019-01-09 DIAGNOSIS — R1031 Right lower quadrant pain: Secondary | ICD-10-CM

## 2019-01-09 DIAGNOSIS — K567 Ileus, unspecified: Secondary | ICD-10-CM | POA: Insufficient documentation

## 2019-01-09 DIAGNOSIS — K9189 Other postprocedural complications and disorders of digestive system: Secondary | ICD-10-CM | POA: Insufficient documentation

## 2019-01-09 LAB — COMPREHENSIVE METABOLIC PANEL
ALT: 19 U/L (ref 0–44)
AST: 25 U/L (ref 15–41)
Albumin: 4.5 g/dL (ref 3.5–5.0)
Alkaline Phosphatase: 57 U/L (ref 38–126)
Anion gap: 9 (ref 5–15)
BUN: 16 mg/dL (ref 6–20)
CO2: 25 mmol/L (ref 22–32)
Calcium: 9 mg/dL (ref 8.9–10.3)
Chloride: 102 mmol/L (ref 98–111)
Creatinine, Ser: 0.89 mg/dL (ref 0.61–1.24)
GFR calc Af Amer: >60 mL/min (ref >60)
GFR calc non Af Amer: >60 mL/min (ref >60)
Glucose, Bld: 115 mg/dL — ABNORMAL HIGH (ref 70–99)
Potassium: 4 mmol/L (ref 3.5–5.1)
Sodium: 136 mmol/L (ref 135–145)
Total Bilirubin: 1 mg/dL (ref 0.3–1.2)
Total Protein: 8.7 g/dL — ABNORMAL HIGH (ref 6.5–8.1)

## 2019-01-09 LAB — CBC WITH DIFFERENTIAL/PLATELET
Abs Immature Granulocytes: 0.02 10*3/uL (ref 0.00–0.07)
Basophils Absolute: 0 10*3/uL (ref 0.0–0.1)
Basophils Relative: 0 %
Eosinophils Absolute: 0 10*3/uL (ref 0.0–0.5)
Eosinophils Relative: 0 %
HCT: 49.3 % (ref 39.0–52.0)
Hemoglobin: 16.7 g/dL (ref 13.0–17.0)
Immature Granulocytes: 0 %
Lymphocytes Relative: 15 %
Lymphs Abs: 1.6 10*3/uL (ref 0.7–4.0)
MCH: 29 pg (ref 26.0–34.0)
MCHC: 33.9 g/dL (ref 30.0–36.0)
MCV: 85.6 fL (ref 80.0–100.0)
Monocytes Absolute: 0.7 10*3/uL (ref 0.1–1.0)
Monocytes Relative: 7 %
Neutro Abs: 8.2 10*3/uL — ABNORMAL HIGH (ref 1.7–7.7)
Neutrophils Relative %: 78 %
Platelets: 190 10*3/uL (ref 150–400)
RBC: 5.76 MIL/uL (ref 4.22–5.81)
RDW: 13.3 % (ref 11.5–15.5)
WBC: 10.5 10*3/uL (ref 4.0–10.5)
nRBC: 0 % (ref 0.0–0.2)

## 2019-01-09 LAB — LIPASE, BLOOD: Lipase: 35 U/L (ref 11–51)

## 2019-01-09 MED ORDER — IOHEXOL 300 MG/ML  SOLN
100.0000 mL | Freq: Once | INTRAMUSCULAR | Status: AC | PRN
  Administered 2019-01-09: 100 mL via INTRAVENOUS

## 2019-01-09 MED ORDER — SODIUM CHLORIDE 0.9 % IV BOLUS
1000.0000 mL | Freq: Once | INTRAVENOUS | Status: AC
  Administered 2019-01-09: 1000 mL via INTRAVENOUS

## 2019-01-09 MED ORDER — SODIUM CHLORIDE (PF) 0.9 % IJ SOLN
INTRAMUSCULAR | Status: AC
  Filled 2019-01-09: qty 50

## 2019-01-09 MED ORDER — DOCUSATE SODIUM 100 MG PO CAPS: 100.0000 mg | ORAL_CAPSULE | Freq: Two times a day (BID) | ORAL | 0 refills | Status: AC

## 2019-01-09 MED ORDER — POLYETHYLENE GLYCOL 3350 17 G PO PACK: 17.0000 g | PACK | Freq: Every day | ORAL | 0 refills | Status: AC

## 2019-01-09 NOTE — ED Notes (Signed)
Patient ambulated to restroom without complication. Patient did require some assistance getting up in bed due to abdominal pain.
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
## 2019-01-09 NOTE — ED Notes (Signed)
Pt transported to treatment  room 19. Translator phone at bedside. . Pt required minimal assisted to bed. Cardiac monitor initiated.

## 2019-01-09 NOTE — ED Notes (Signed)
ED Provider at bedside. 

## 2019-01-09 NOTE — ED Triage Notes (Signed)
Per daughter: Primary language Spanish: Father stated that he felt faint while standing to brush his teeth . Wife caught him before he fell, stating that he was "sweating and eyes rolled"the patient was able to stand, and transfer from car to wheelchair. Pt currently reports light headedness and r/lower abdominal pain. Pt is 3 days post op for hernia repair. Denies nausea.

## 2019-01-11 ENCOUNTER — Encounter (HOSPITAL_COMMUNITY): Payer: Self-pay | Admitting: General Surgery

## 2020-04-20 IMAGING — CT CT ABDOMEN AND PELVIS WITH CONTRAST
2 of 5 series · 15 of 46 positions shown, 17 images · IV contrast (ISOVUE)
Comparison: None.

CLINICAL DATA: Lightheadedness and RIGHT lower abdominal pain.
Three days postop for hernia repair.

EXAM:
CT ABDOMEN AND PELVIS WITH CONTRAST
TECHNIQUE: Multidetector CT imaging of the abdomen and pelvis was performed
using the standard protocol following bolus administration of
intravenous contrast.
CONTRAST:  100mL OMNIPAQUE IOHEXOL 300 MG/ML  SOLN

[Series 2: axial st · axial · 0.73mm/px · z∈[-439,+1]mm · 12 of 104 slices shown, 14 images]
[im 8/104  soft-tissue]
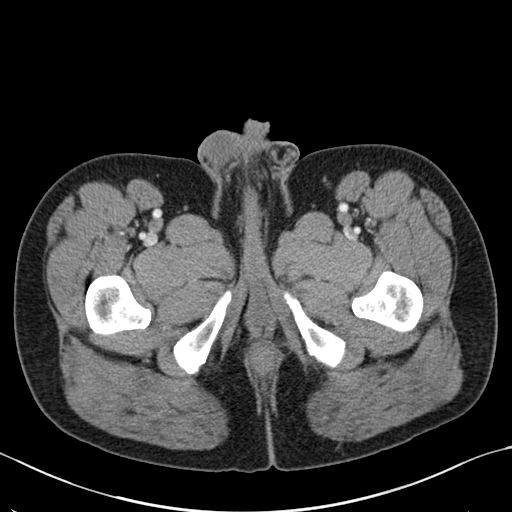
[im 8/104  bone]
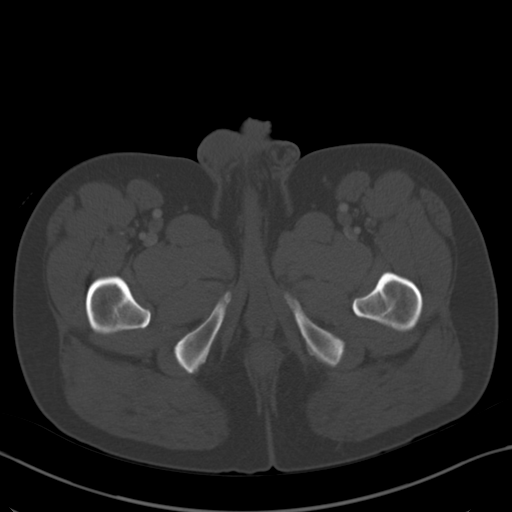
[im 15/104  soft-tissue]
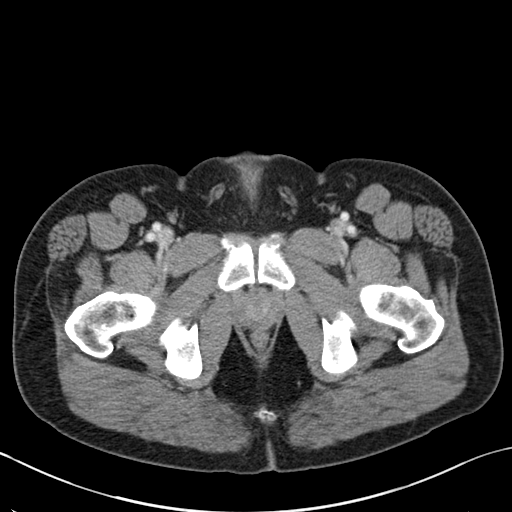
[im 23/104  soft-tissue]
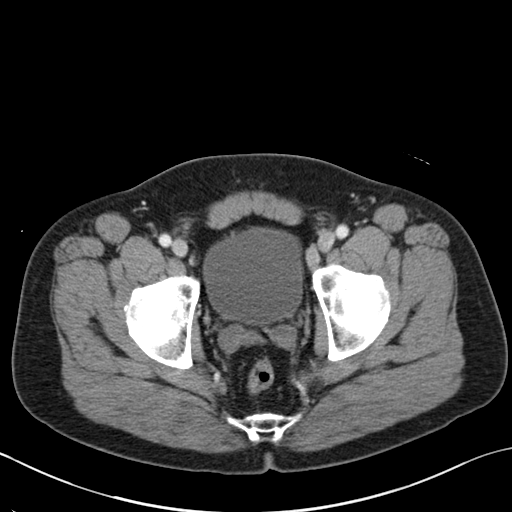
[im 30/104  soft-tissue]
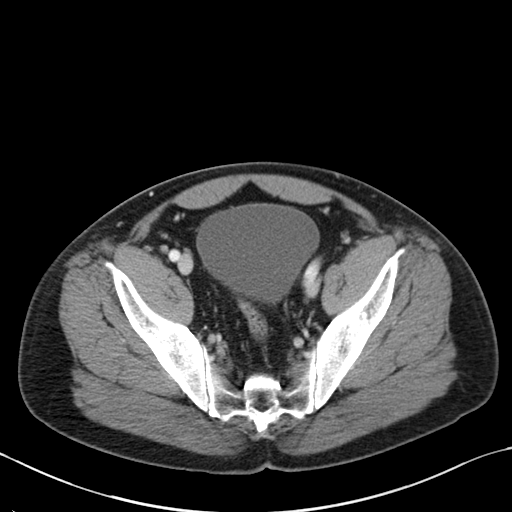
[im 37/104  soft-tissue]
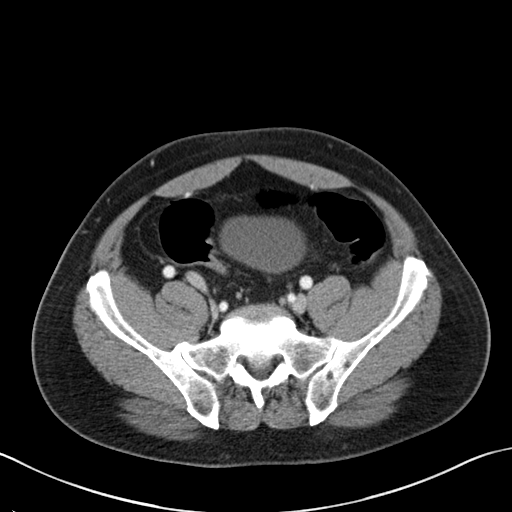
[im 45/104  soft-tissue]
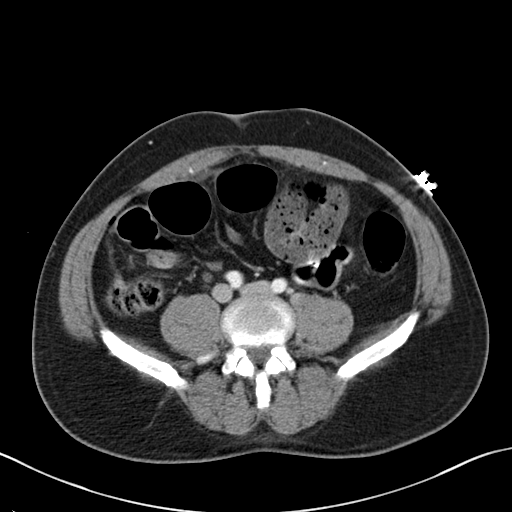
[im 59/104  soft-tissue]
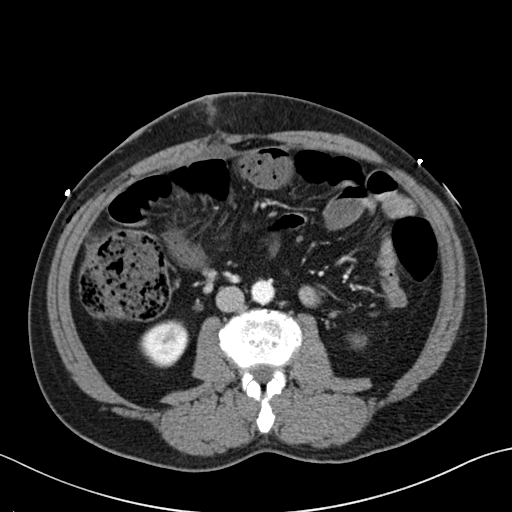
[im 67/104  soft-tissue]
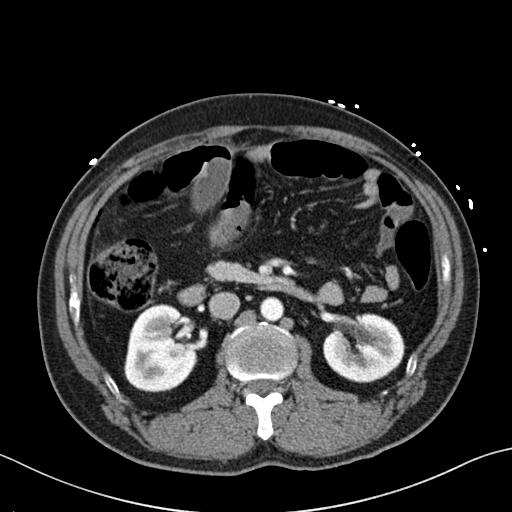
[im 74/104  soft-tissue]
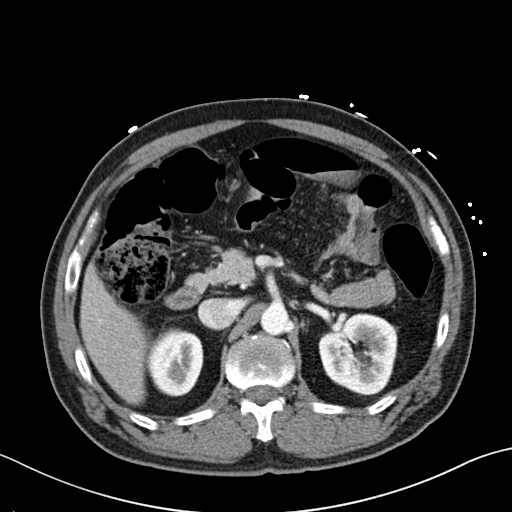
[im 74/104  bone]
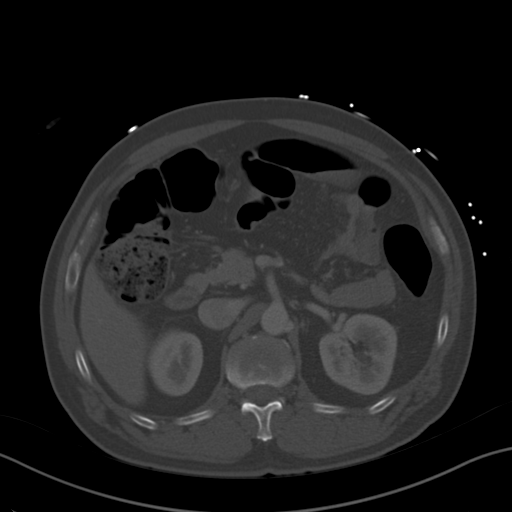
[im 81/104  soft-tissue]
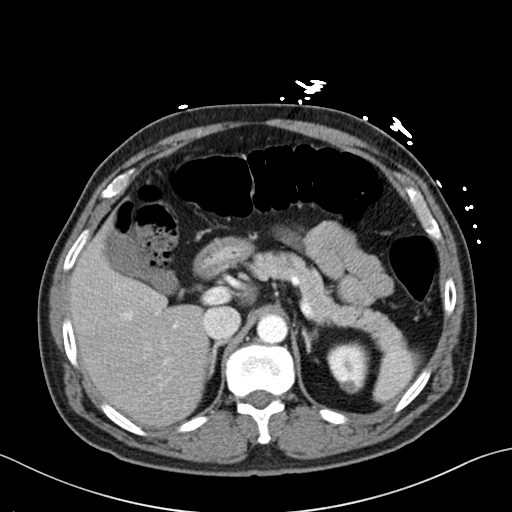
[im 89/104  soft-tissue]
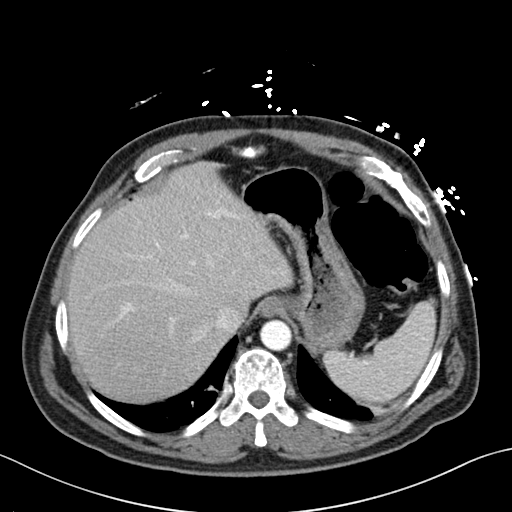
[im 96/104  soft-tissue]
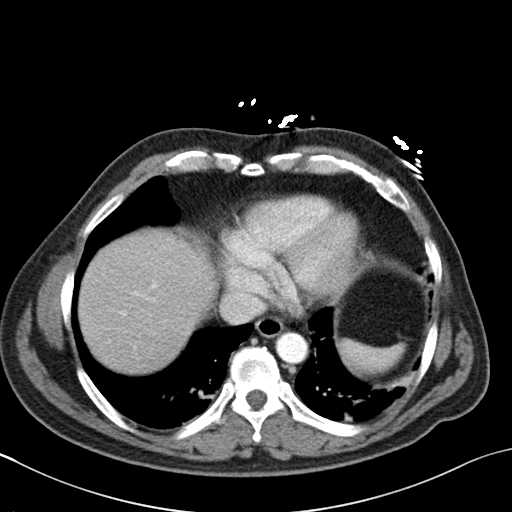

[Series 5: coronal st · coronal · 0.80mm/px · 3 of 133 slices shown]
[im 45/133  soft-tissue]
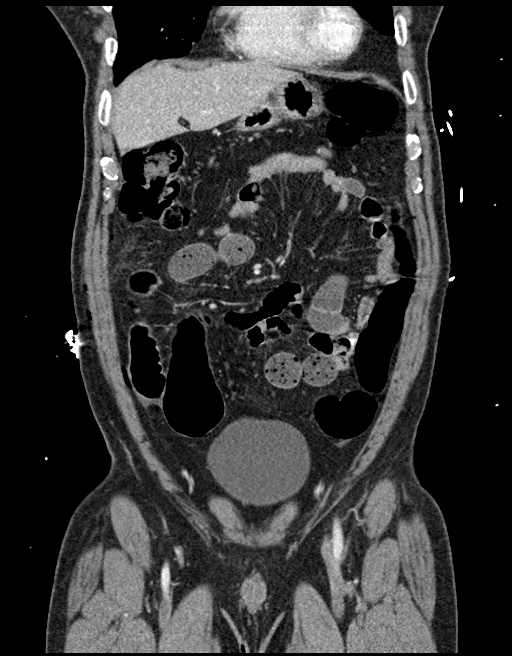
[im 59/133  soft-tissue]
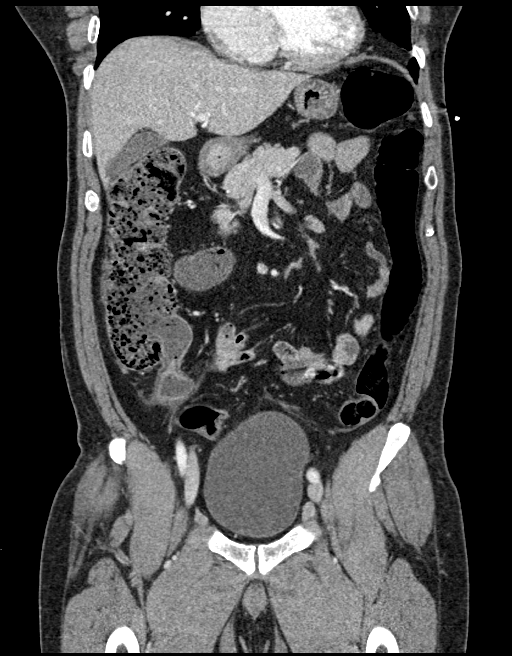
[im 74/133  soft-tissue]
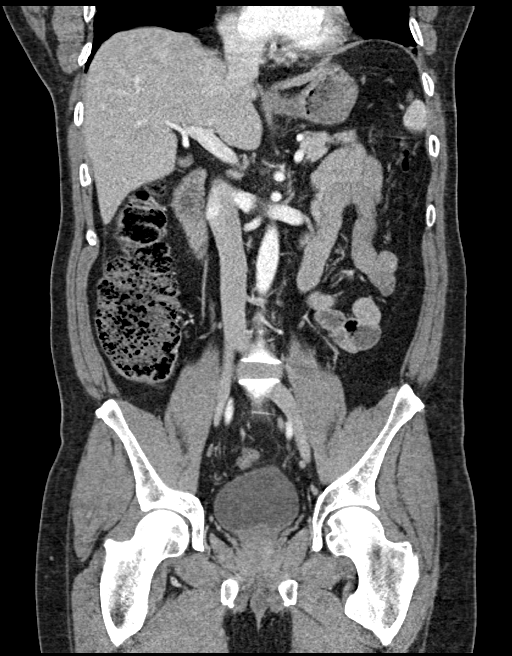

[15 of 46 positions shown; findings below may reference images not displayed]

FINDINGS: Lower chest: Patchy bibasilar consolidations, LEFT greater than
RIGHT, atelectasis versus pneumonia.

Hepatobiliary: No focal liver abnormality is seen. No gallstones,
gallbladder wall thickening, or biliary dilatation.

Pancreas: Unremarkable. No pancreatic ductal dilatation or
surrounding inflammatory changes.

Spleen: Normal in size without focal abnormality.

Adrenals/Urinary Tract: Adrenal glands appear normal. Kidneys are
unremarkable without suspicious mass, stone or hydronephrosis.
Probable small cyst within the upper pole of the LEFT kidney,
measuring 10 mm, too small to definitively characterize. No ureteral
or bladder calculi identified. Bladder appears normal.

Stomach/Bowel: Gas is present throughout the nondistended colon.
Moderate amount of stool in the RIGHT colon. Gas and feculent
material is present within the distal and mid small bowel, some
components being upper normal in caliber. No obstructing transition
zone identified suggesting postoperative ileus.

Stomach is unremarkable, partially decompressed. Proximal small
bowel is also relatively decompressed.

Vascular/Lymphatic: No significant vascular findings are present. No
enlarged abdominal or pelvic lymph nodes.

Reproductive: Prostate is unremarkable.

Other: No abscess collection seen. No free intraperitoneal air.

Musculoskeletal: Small foci of extraperitoneal air along the RIGHT
abdominal wall, compatible with the given history of recent hernia
repair. Associated edema within the subcutaneous soft tissues. No
acute or suspicious osseous finding.
IMPRESSION: 1. Gas and feculent material within the mid and distal small bowel,
with some components of these small bowel loops at upper normal in
caliber, suggesting postoperative ileus. No obstructing transition
zone identified. Stomach and proximal small bowel are relatively
decompressed.
2. Gas throughout the nondistended colon. Moderate amount of stool
in the right colon.
3. Small foci of extraperitoneal air along the right abdominal wall,
compatible with the given history of recent hernia repair.
Associated edema within the subcutaneous soft tissues. No abscess
collection seen.
4. Patchy bibasilar consolidations, left greater than right,
atelectasis versus pneumonia, atelectasis favored in the absence of
pneumonia.
